# Patient Record
Sex: Male | Born: 1965 | Race: White | Hispanic: No | Marital: Single | Smoking: Current every day smoker
Health system: Southern US, Community
[De-identification: ages and names within clinical notes are randomized; demographics above are authoritative.]

## PROBLEM LIST (undated history)

## (undated) DIAGNOSIS — J449 Chronic obstructive pulmonary disease, unspecified: Secondary | ICD-10-CM

## (undated) DIAGNOSIS — J439 Emphysema, unspecified: Secondary | ICD-10-CM

---

## 2005-04-29 ENCOUNTER — Emergency Department: Payer: Self-pay | Admitting: Emergency Medicine

## 2005-07-25 ENCOUNTER — Emergency Department: Payer: Self-pay | Admitting: Emergency Medicine

## 2005-09-02 ENCOUNTER — Emergency Department: Payer: Self-pay | Admitting: Emergency Medicine

## 2005-11-18 ENCOUNTER — Emergency Department: Payer: Self-pay | Admitting: Emergency Medicine

## 2010-12-04 ENCOUNTER — Emergency Department: Payer: Self-pay | Admitting: Emergency Medicine

## 2014-01-27 ENCOUNTER — Emergency Department: Payer: Self-pay | Admitting: Emergency Medicine

## 2014-01-27 LAB — CBC WITH DIFFERENTIAL/PLATELET
BASOS PCT: 0.6 %
Basophil #: 0.1 10*3/uL (ref 0.0–0.1)
EOS ABS: 0 10*3/uL (ref 0.0–0.7)
Eosinophil %: 0.4 %
HCT: 38.6 % — ABNORMAL LOW (ref 40.0–52.0)
HGB: 12.7 g/dL — ABNORMAL LOW (ref 13.0–18.0)
LYMPHS ABS: 1.5 10*3/uL (ref 1.0–3.6)
Lymphocyte %: 16.5 %
MCH: 32.9 pg (ref 26.0–34.0)
MCHC: 32.8 g/dL (ref 32.0–36.0)
MCV: 100 fL (ref 80–100)
MONOS PCT: 8.9 %
Monocyte #: 0.8 x10 3/mm (ref 0.2–1.0)
Neutrophil #: 6.5 10*3/uL (ref 1.4–6.5)
Neutrophil %: 73.6 %
Platelet: 389 10*3/uL (ref 150–440)
RBC: 3.85 10*6/uL — ABNORMAL LOW (ref 4.40–5.90)
RDW: 13.3 % (ref 11.5–14.5)
WBC: 8.9 10*3/uL (ref 3.8–10.6)

## 2014-01-27 LAB — BASIC METABOLIC PANEL
Anion Gap: 8 (ref 7–16)
BUN: 18 mg/dL (ref 7–18)
CALCIUM: 9.2 mg/dL (ref 8.5–10.1)
Chloride: 105 mmol/L (ref 98–107)
Co2: 28 mmol/L (ref 21–32)
Creatinine: 1 mg/dL (ref 0.60–1.30)
EGFR (Non-African Amer.): >60
Glucose: 113 mg/dL — ABNORMAL HIGH (ref 65–99)
Osmolality: 284 (ref 275–301)
Potassium: 4.3 mmol/L (ref 3.5–5.1)
Sodium: 141 mmol/L (ref 136–145)

## 2014-05-10 ENCOUNTER — Emergency Department: Payer: Self-pay | Admitting: Emergency Medicine

## 2014-05-10 LAB — URINALYSIS, COMPLETE
BILIRUBIN, UR: NEGATIVE
Bacteria: NONE SEEN
Blood: NEGATIVE
Glucose,UR: NEGATIVE mg/dL (ref 0–75)
Hyaline Cast: 6
KETONE: NEGATIVE
Leukocyte Esterase: NEGATIVE
Nitrite: NEGATIVE
PH: 5 (ref 4.5–8.0)
Protein: NEGATIVE
RBC,UR: 1 /HPF (ref 0–5)
SPECIFIC GRAVITY: 1.025 (ref 1.003–1.030)

## 2014-05-10 LAB — CBC
HCT: 38.1 % — ABNORMAL LOW (ref 40.0–52.0)
HGB: 13.1 g/dL (ref 13.0–18.0)
MCH: 33.4 pg (ref 26.0–34.0)
MCHC: 34.4 g/dL (ref 32.0–36.0)
MCV: 97 fL (ref 80–100)
Platelet: 237 10*3/uL (ref 150–440)
RBC: 3.92 10*6/uL — ABNORMAL LOW (ref 4.40–5.90)
RDW: 12.8 % (ref 11.5–14.5)
WBC: 7.5 10*3/uL (ref 3.8–10.6)

## 2014-05-10 LAB — LIPASE, BLOOD: LIPASE: 285 U/L (ref 73–393)

## 2014-05-10 LAB — COMPREHENSIVE METABOLIC PANEL
ALBUMIN: 3.5 g/dL (ref 3.4–5.0)
ANION GAP: 6 — AB (ref 7–16)
Alkaline Phosphatase: 88 U/L
BUN: 16 mg/dL (ref 7–18)
Bilirubin,Total: 0.6 mg/dL (ref 0.2–1.0)
CREATININE: 1.52 mg/dL — AB (ref 0.60–1.30)
Calcium, Total: 8.5 mg/dL (ref 8.5–10.1)
Chloride: 106 mmol/L (ref 98–107)
Co2: 27 mmol/L (ref 21–32)
EGFR (African American): >60
EGFR (Non-African Amer.): 53 — ABNORMAL LOW
Glucose: 82 mg/dL (ref 65–99)
Osmolality: 278 (ref 275–301)
Potassium: 3.8 mmol/L (ref 3.5–5.1)
SGOT(AST): 30 U/L (ref 15–37)
SGPT (ALT): 31 U/L
Sodium: 139 mmol/L (ref 136–145)
TOTAL PROTEIN: 7.4 g/dL (ref 6.4–8.2)

## 2015-09-22 ENCOUNTER — Encounter: Payer: Self-pay | Admitting: Emergency Medicine

## 2015-09-22 ENCOUNTER — Emergency Department
Admission: EM | Admit: 2015-09-22 | Discharge: 2015-09-22 | Disposition: A | Discharge status: Home / Self Care | Payer: Self-pay | Attending: Emergency Medicine | Admitting: Emergency Medicine

## 2015-09-22 ENCOUNTER — Emergency Department: Payer: Self-pay

## 2015-09-22 DIAGNOSIS — F17210 Nicotine dependence, cigarettes, uncomplicated: Secondary | ICD-10-CM | POA: Insufficient documentation

## 2015-09-22 DIAGNOSIS — Y9289 Other specified places as the place of occurrence of the external cause: Secondary | ICD-10-CM | POA: Insufficient documentation

## 2015-09-22 DIAGNOSIS — Y9389 Activity, other specified: Secondary | ICD-10-CM | POA: Insufficient documentation

## 2015-09-22 DIAGNOSIS — Y998 Other external cause status: Secondary | ICD-10-CM | POA: Insufficient documentation

## 2015-09-22 DIAGNOSIS — W231XXA Caught, crushed, jammed, or pinched between stationary objects, initial encounter: Secondary | ICD-10-CM | POA: Insufficient documentation

## 2015-09-22 DIAGNOSIS — S60221A Contusion of right hand, initial encounter: Secondary | ICD-10-CM | POA: Insufficient documentation

## 2015-09-22 HISTORY — DX: Chronic obstructive pulmonary disease, unspecified: J44.9

## 2015-09-22 MED ORDER — OXYCODONE-ACETAMINOPHEN 5-325 MG PO TABS
1.0000 | ORAL_TABLET | Freq: Once | ORAL | Status: AC
  Administered 2015-09-22: 1 via ORAL
  Filled 2015-09-22: qty 1

## 2015-09-22 NOTE — ED Notes (Signed)
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

## 2015-09-22 NOTE — ED Notes (Signed)
Ice pack to right hand

## 2015-09-22 NOTE — ED Notes (Signed)
Patient has swelling to right hand.  States he injured the hand yesterday. Positive radial pulse.

## 2015-09-22 NOTE — ED Provider Notes (Signed)
[facility] Emergency Department Provider Note  ____________________________________________  Time seen: Approximately 10:42 AM  I have reviewed the triage vital signs and the nursing notes.   HISTORY  Chief Complaint Hand Injury    HPI Thomas Poole is a 50 y.o. male , NAD, reports to the emergency department with one-day history of right hand swelling and pain after blunt trauma last night. States he was on a ladder and hopping it down a roof line and got his right hand caught in between the ladder in the building. Has had significant swelling and pain since the injury. History of 3-4 old fractures of the right hand but is uncertain of exactly how many.   Past Medical History  Diagnosis Date  . COPD (chronic obstructive pulmonary disease) (HCC)     There are no active problems to display for this patient.   History reviewed. No pertinent past surgical history.  No current outpatient prescriptions on file.  Allergies Bee venom and Vicodin  No family history on file.  Social History Social History  Substance Use Topics  . Smoking status: Current Every Day Smoker -- 0.25 packs/day    Types: Cigarettes  . Smokeless tobacco: None  . Alcohol Use: Yes     Comment: occas     Review of Systems  Constitutional: No fever/chills, fatigue.  Eyes: No visual changes Cardiovascular: No chest pain. Respiratory:  No shortness of breath. No wheezing.  Gastrointestinal: No abdominal pain.  No nausea, vomiting.   Musculoskeletal: Positive right hand pain. Negative for back pain.  Skin: Positive bruising, swelling about right hand. Negative for rash, lacerations. Neurological: Negative for headaches, focal weakness or numbness. 10-point ROS otherwise negative.  ____________________________________________   PHYSICAL EXAM:  VITAL SIGNS: ED Triage Vitals  Enc Vitals Group     BP 09/22/15 1022 140/104 mmHg     Pulse Rate 09/22/15 1022 75   Resp 09/22/15 1022 18     Temp 09/22/15 1022 98.1 F (36.7 C)     Temp Source 09/22/15 1022 Oral     SpO2 09/22/15 1022 97 %     Weight 09/22/15 1013 228 lb (103.42 kg)     Height 09/22/15 1013 6' (1.829 m)     Head Cir --      Peak Flow --      Pain Score 09/22/15 1014 9     Pain Loc --      Pain Edu? --      Excl. in GC? --     Constitutional: Alert and oriented. Well appearing and in no acute distress. Eyes: Conjunctivae are normal. Head: Atraumatic. Cardiovascular:  Good peripheral circulation noting right upper extremity with 2+ pulses. Respiratory: Normal respiratory effort without tachypnea or retractions.  Musculoskeletal: Tenderness to palpation about the back of the right hand following metacarpals #2-5. Range of motion of the right hand limited due to pain and swelling.  Range of motion of fingers 133 within normal limits. Capillary refill less than 3 seconds of all right fingers. No joint effusions. Neurologic:  Normal speech and language. No gross focal neurologic deficits are appreciated.  Skin:  Skin is swollen with blue ecchymosis noted about the back of the right hand following metacarpals #2-5. No open wounds, lacerations. No oozing or weeping.  Psychiatric: Mood and affect are normal. Speech and behavior are normal. Patient exhibits appropriate insight and judgement.   ____________________________________________   LABS  None  ____________________________________________  EKG  None ____________________________________________  RADIOLOGY I  have personally viewed and evaluated these images (plain radiographs) as part of my medical decision making, as well as reviewing the written report by the radiologist.  Dg Hand Complete Right  09/22/2015  CLINICAL DATA:  Right hand swelling, patient's hand caught between a ladder and body yesterday, medial hand pain EXAM: RIGHT HAND - COMPLETE 3+ VIEW COMPARISON:  04/29/2005 FINDINGS: Five views of the right hand  submitted. No evidence of acute fracture or subluxation. Stable chronic deformity of right third metacarpal. There is interval old appearing fracture deformity of the fifth metacarpal. Clinical correlation is necessary. Diffuse soft tissue swelling metacarpal region. IMPRESSION: No evidence of acute fracture or subluxation. Stable chronic deformity of right third metacarpal. There is interval old appearing fracture deformity of the fifth metacarpal. Clinical correlation is necessary. Diffuse soft tissue swelling metacarpal region. Electronically Signed   By: Natasha Mead M.D.   On: 09/22/2015 11:31    ____________________________________________    PROCEDURES  Procedure(s) performed: None    Medications  oxyCODONE-acetaminophen (PERCOCET/ROXICET) 5-325 MG per tablet 1 tablet (1 tablet Oral Given 09/22/15 1146)     ____________________________________________   INITIAL IMPRESSION / ASSESSMENT AND PLAN / ED COURSE  Pertinent imaging results that were available during my care of the patient were reviewed by me and considered in my medical decision making (see chart for details).  Patient's diagnosis is consistent with contusion of the right hand as no acute fractures noted on x-ray today. Patient will be discharged home with instructions to take Tylenol [phone] mg every 6-8 hours as needed for pain. Apply ice to the affected area 20 minutes 3-4 times daily. Keep right hand elevated. Patient notes he takes a baby aspirin daily for heart health and I have advised for him to hold such for 2-3 days until swelling and bruising have decreased. Patient is to follow up with Dr. Lutricia Horsfall with [facility] if symptoms persist past this treatment course. Patient is given ED precautions to return to the ED for any worsening or new symptoms.      ____________________________________________  FINAL CLINICAL IMPRESSION(S) / ED DIAGNOSES  Final diagnoses:  Contusion of right hand,  initial encounter      NEW MEDICATIONS STARTED DURING THIS VISIT:  There are no discharge medications for this patient.        Hope Pigeon, PA-C 09/22/15 1203  Jennye Moccasin, MD 09/22/15 1229

## 2015-09-22 NOTE — Discharge Instructions (Signed)
Hand Contusion °A hand contusion is a deep bruise on your hand area. Contusions are the result of an injury that caused bleeding under the skin. The contusion may turn blue, purple, or yellow. Minor injuries will give you a painless contusion, but more severe contusions may stay painful and swollen for a few weeks. °CAUSES  °A contusion is usually caused by a blow, trauma, or direct force to an area of the body. °SYMPTOMS  °· Swelling and redness of the injured area. °· Discoloration of the injured area. °· Tenderness and soreness of the injured area. °· Pain. °DIAGNOSIS  °The diagnosis can be made by taking a history and performing a physical exam. An X-ray, CT scan, or MRI may be needed to determine if there were any associated injuries, such as broken bones (fractures). °TREATMENT  °Often, the best treatment for a hand contusion is resting, elevating, icing, and applying cold compresses to the injured area. Over-the-counter medicines may also be recommended for pain control. °HOME CARE INSTRUCTIONS  °· Put ice on the injured area. °· Put ice in a plastic bag. °· Place a towel between your skin and the bag. °· Leave the ice on for 15-20 minutes, 03-04 times a day. °· Only take over-the-counter or prescription medicines as directed by your caregiver. Your caregiver may recommend avoiding anti-inflammatory medicines (aspirin, ibuprofen, and naproxen) for 48 hours because these medicines may increase bruising. °· If told, use an elastic wrap as directed. This can help reduce swelling. You may remove the wrap for sleeping, showering, and bathing. If your fingers become numb, cold, or blue, take the wrap off and reapply it more loosely. °· Elevate your hand with pillows to reduce swelling. °· Avoid overusing your hand if it is painful. °SEEK IMMEDIATE MEDICAL CARE IF:  °· You have increased redness, swelling, or pain in your hand. °· Your swelling or pain is not relieved with medicines. °· You have loss of feeling in  your hand or are unable to move your fingers. °· Your hand turns cold or blue. °· You have pain when you move your fingers. °· Your hand becomes warm to the touch. °· Your contusion does not improve in 2 days. °MAKE SURE YOU:  °· Understand these instructions. °· Will watch your condition. °· Will get help right away if you are not doing well or get worse. °  °This information is not intended to replace advice given to you by your health care provider. Make sure you discuss any questions you have with your health care provider. °  °Document Released: 01/12/2002 Document Revised: 04/16/2012 Document Reviewed: 01/14/2012 °Elsevier Interactive Patient Education ©2016 [employer] ° °Cryotherapy °Cryotherapy means treatment with cold. Ice or gel packs can be used to reduce both pain and swelling. Ice is the most helpful within the first 24 to 48 hours after an injury or flare-up from overusing a muscle or joint. Sprains, strains, spasms, burning pain, shooting pain, and aches can all be eased with ice. Ice can also be used when recovering from surgery. Ice is effective, has very few side effects, and is safe for most people to use. °PRECAUTIONS  °Ice is not a safe treatment option for people with: °· Raynaud phenomenon. This is a condition affecting small blood vessels in the extremities. Exposure to cold may cause your problems to return. °· Cold hypersensitivity. There are many forms of cold hypersensitivity, including: °¨ Cold urticaria. Red, itchy hives appear on the skin when the tissues begin to warm   after being iced. °¨ Cold erythema. This is a red, itchy rash caused by exposure to cold. °¨ Cold hemoglobinuria. Red blood cells break down when the tissues begin to warm after being iced. The hemoglobin that carry oxygen are passed into the urine because they cannot combine with blood proteins fast enough. °· Numbness or altered sensitivity in the area being iced. °If you have any of the following conditions, do  not use ice until you have discussed cryotherapy with your caregiver: °· Heart conditions, such as arrhythmia, angina, or chronic heart disease. °· High blood pressure. °· Healing wounds or open skin in the area being iced. °· Current infections. °· Rheumatoid arthritis. °· Poor circulation. °· Diabetes. °Ice slows the blood flow in the region it is applied. This is beneficial when trying to stop inflamed tissues from spreading irritating chemicals to surrounding tissues. However, if you expose your skin to cold temperatures for too long or without the proper protection, you can damage your skin or nerves. Watch for signs of skin damage due to cold. °HOME CARE INSTRUCTIONS °Follow these tips to use ice and cold packs safely. °· Place a dry or damp towel between the ice and skin. A damp towel will cool the skin more quickly, so you may need to shorten the time that the ice is used. °· For a more rapid response, add gentle compression to the ice. °· Ice for no more than 10 to 20 minutes at a time. The bonier the area you are icing, the less time it will take to get the benefits of ice. °· Check your skin after 5 minutes to make sure there are no signs of a poor response to cold or skin damage. °· Rest 20 minutes or more between uses. °· Once your skin is numb, you can end your treatment. You can test numbness by very lightly touching your skin. The touch should be so light that you do not see the skin dimple from the pressure of your fingertip. When using ice, most people will feel these normal sensations in this order: cold, burning, aching, and numbness. °· Do not use ice on someone who cannot communicate their responses to pain, such as small children or people with dementia. °HOW TO MAKE AN ICE PACK °Ice packs are the most common way to use ice therapy. Other methods include ice massage, ice baths, and cryosprays. Muscle creams that cause a cold, tingly feeling do not offer the same benefits that ice offers and  should not be used as a substitute unless recommended by your caregiver. °To make an ice pack, do one of the following: °· Place crushed ice or a bag of frozen vegetables in a sealable plastic bag. Squeeze out the excess air. Place this bag inside another plastic bag. Slide the bag into a pillowcase or place a damp towel between your skin and the bag. °· Mix 3 parts water with 1 part rubbing alcohol. Freeze the mixture in a sealable plastic bag. When you remove the mixture from the freezer, it will be slushy. Squeeze out the excess air. Place this bag inside another plastic bag. Slide the bag into a pillowcase or place a damp towel between your skin and the bag. °SEEK MEDICAL CARE IF: °· You develop white spots on your skin. This may give the skin a blotchy (mottled) appearance. °· Your skin turns blue or pale. °· Your skin becomes waxy or hard. °· Your swelling gets worse. °MAKE SURE YOU:  °· Understand   these instructions. °· Will watch your condition. °· Will get help right away if you are not doing well or get worse. °  °This information is not intended to replace advice given to you by your health care provider. Make sure you discuss any questions you have with your health care provider. °  °Document Released: 03/19/2011 Document Revised: 08/13/2014 Document Reviewed: 03/19/2011 °Elsevier Interactive Patient Education ©2016 Elsevier Inc. ° °

## 2016-12-07 ENCOUNTER — Emergency Department: Payer: Self-pay

## 2016-12-07 ENCOUNTER — Encounter: Payer: Self-pay | Admitting: Emergency Medicine

## 2016-12-07 ENCOUNTER — Emergency Department
Admission: EM | Admit: 2016-12-07 | Discharge: 2016-12-08 | Disposition: A | Discharge status: Home / Self Care | Payer: Self-pay | Attending: Emergency Medicine | Admitting: Emergency Medicine

## 2016-12-07 DIAGNOSIS — F410 Panic disorder [episodic paroxysmal anxiety] without agoraphobia: Secondary | ICD-10-CM | POA: Insufficient documentation

## 2016-12-07 DIAGNOSIS — R202 Paresthesia of skin: Secondary | ICD-10-CM

## 2016-12-07 DIAGNOSIS — Z79899 Other long term (current) drug therapy: Secondary | ICD-10-CM | POA: Insufficient documentation

## 2016-12-07 DIAGNOSIS — F1410 Cocaine abuse, uncomplicated: Secondary | ICD-10-CM | POA: Insufficient documentation

## 2016-12-07 DIAGNOSIS — F17210 Nicotine dependence, cigarettes, uncomplicated: Secondary | ICD-10-CM | POA: Insufficient documentation

## 2016-12-07 DIAGNOSIS — F1910 Other psychoactive substance abuse, uncomplicated: Secondary | ICD-10-CM | POA: Insufficient documentation

## 2016-12-07 DIAGNOSIS — R0689 Other abnormalities of breathing: Secondary | ICD-10-CM | POA: Insufficient documentation

## 2016-12-07 DIAGNOSIS — J449 Chronic obstructive pulmonary disease, unspecified: Secondary | ICD-10-CM | POA: Insufficient documentation

## 2016-12-07 DIAGNOSIS — F1210 Cannabis abuse, uncomplicated: Secondary | ICD-10-CM | POA: Insufficient documentation

## 2016-12-07 DIAGNOSIS — F1010 Alcohol abuse, uncomplicated: Secondary | ICD-10-CM | POA: Insufficient documentation

## 2016-12-07 DIAGNOSIS — Z7982 Long term (current) use of aspirin: Secondary | ICD-10-CM | POA: Insufficient documentation

## 2016-12-07 HISTORY — DX: Emphysema, unspecified: J43.9

## 2016-12-07 LAB — URINALYSIS, COMPLETE (UACMP) WITH MICROSCOPIC
BILIRUBIN URINE: NEGATIVE
Bacteria, UA: NONE SEEN
GLUCOSE, UA: NEGATIVE mg/dL
HGB URINE DIPSTICK: NEGATIVE
Ketones, ur: NEGATIVE mg/dL
LEUKOCYTES UA: NEGATIVE
NITRITE: NEGATIVE
PH: 5 (ref 5.0–8.0)
Protein, ur: NEGATIVE mg/dL
SPECIFIC GRAVITY, URINE: 1.013 (ref 1.005–1.030)
Squamous Epithelial / LPF: NONE SEEN

## 2016-12-07 LAB — CBC WITH DIFFERENTIAL/PLATELET
BASOS PCT: 1 %
Basophils Absolute: 0.1 10*3/uL (ref 0–0.1)
Eosinophils Absolute: 0.1 10*3/uL (ref 0–0.7)
Eosinophils Relative: 1 %
HEMATOCRIT: 38.3 % — AB (ref 40.0–52.0)
Hemoglobin: 13.3 g/dL (ref 13.0–18.0)
LYMPHS ABS: 3.5 10*3/uL (ref 1.0–3.6)
Lymphocytes Relative: 42 %
MCH: 33.7 pg (ref 26.0–34.0)
MCHC: 34.8 g/dL (ref 32.0–36.0)
MCV: 97.1 fL (ref 80.0–100.0)
MONO ABS: 0.8 10*3/uL (ref 0.2–1.0)
Monocytes Relative: 10 %
NEUTROS ABS: 3.7 10*3/uL (ref 1.4–6.5)
Neutrophils Relative %: 46 %
PLATELETS: 275 10*3/uL (ref 150–440)
RBC: 3.94 MIL/uL — ABNORMAL LOW (ref 4.40–5.90)
RDW: 13.7 % (ref 11.5–14.5)
WBC: 8.2 10*3/uL (ref 3.8–10.6)

## 2016-12-07 LAB — COMPREHENSIVE METABOLIC PANEL
ALK PHOS: 57 U/L (ref 38–126)
ALT: 21 U/L (ref 17–63)
AST: 37 U/L (ref 15–41)
Albumin: 3.9 g/dL (ref 3.5–5.0)
Anion gap: 10 (ref 5–15)
BUN: 11 mg/dL (ref 6–20)
CHLORIDE: 105 mmol/L (ref 101–111)
CO2: 24 mmol/L (ref 22–32)
CREATININE: 1.13 mg/dL (ref 0.61–1.24)
Calcium: 9.5 mg/dL (ref 8.9–10.3)
GFR calc Af Amer: >60 mL/min (ref >60)
GFR calc non Af Amer: >60 mL/min (ref >60)
Glucose, Bld: 114 mg/dL — ABNORMAL HIGH (ref 65–99)
Potassium: 3.9 mmol/L (ref 3.5–5.1)
SODIUM: 139 mmol/L (ref 135–145)
Total Bilirubin: 0.5 mg/dL (ref 0.3–1.2)
Total Protein: 6.8 g/dL (ref 6.5–8.1)

## 2016-12-07 LAB — LIPASE, BLOOD: Lipase: 26 U/L (ref 11–51)

## 2016-12-07 LAB — TROPONIN I: Troponin I: <0.03 ng/mL (ref <0.03)

## 2016-12-07 LAB — SALICYLATE LEVEL: Salicylate Lvl: <7 mg/dL (ref 2.8–30.0)

## 2016-12-07 LAB — ACETAMINOPHEN LEVEL: Acetaminophen (Tylenol), Serum: <10 ug/mL — ABNORMAL LOW (ref 10–30)

## 2016-12-07 LAB — CK: Total CK: 461 U/L — ABNORMAL HIGH (ref 49–397)

## 2016-12-07 LAB — BRAIN NATRIURETIC PEPTIDE: B NATRIURETIC PEPTIDE 5: 7 pg/mL (ref 0.0–100.0)

## 2016-12-07 MED ORDER — LORAZEPAM 2 MG/ML IJ SOLN
INTRAMUSCULAR | Status: AC
  Administered 2016-12-07: 1 mg via INTRAVENOUS
  Filled 2016-12-07: qty 1

## 2016-12-07 MED ORDER — SODIUM CHLORIDE 0.9 % IV BOLUS (SEPSIS)
1000.0000 mL | Freq: Once | INTRAVENOUS | Status: AC
  Administered 2016-12-07: 1000 mL via INTRAVENOUS

## 2016-12-07 MED ORDER — ASPIRIN 81 MG PO CHEW
CHEWABLE_TABLET | ORAL | Status: AC
Start: 2016-12-07 — End: 2016-12-07
  Administered 2016-12-07: 324 mg via ORAL
  Filled 2016-12-07: qty 4

## 2016-12-07 MED ORDER — LORAZEPAM 2 MG/ML IJ SOLN
1.0000 mg | Freq: Once | INTRAMUSCULAR | Status: AC
  Administered 2016-12-07: 1 mg via INTRAVENOUS
  Filled 2016-12-07: qty 1

## 2016-12-07 NOTE — ED Provider Notes (Addendum)
[facility] Emergency Department Provider Note  ____________________________________________   I have reviewed the triage vital signs and the nursing notes.   HISTORY  Chief Complaint Chest Pain    HPI Thomas Poole is a 51 y.o. male with a history of cocaine abusealcohol abuse, who drank alcohol the sprain used cocaine last night, also history of panic attacks, states that he did not chest pain today however, he became very anxious and jittery and he felt like his entire body was tingling. No focal neurologic deficit, just whole body tingling. This has been going on now since shortly before arrival. He denies any headache or fever. He denies any difficulty talking. He denies vomiting. He does not believe he is in alcohol withdrawal because he just drank alcohol this morning. He states that he has had no chest pain although he "may have had some" a few days ago. EKG showed what I believe to be repolarization abnormality but he is brought in as a precaution as a code STEMI by EMS. We did discuss with the on-call cardiologist after reviewing EKG and they do not feel that STEMI is indicated.   Patient states his symptoms are going on for about a half hour to 45 minutes nothing makes them better nothing makes them worse, no antecedent intervention has been performed.   Past Medical History:  Diagnosis Date  . COPD (chronic obstructive pulmonary disease) (HCC)   . Emphysema lung (HCC)     There are no active problems to display for this patient.   History reviewed. No pertinent surgical history.  Prior to Admission medications   Medication Sig Start Date End Date Taking? Authorizing Provider  albuterol (PROVENTIL HFA;VENTOLIN HFA) 108 (90 Base) MCG/ACT inhaler Inhale 1 puff into the lungs every 6 (six) hours as needed. 04/26/16  Yes Historical Provider, MD  aspirin EC 81 MG tablet Take 81 mg by mouth daily.   Yes Historical Provider, MD  loratadine  (CLARITIN) 10 MG tablet Take 10 mg by mouth daily. 04/26/16 04/26/17 Yes Historical Provider, MD  pravastatin (PRAVACHOL) 40 MG tablet Take 40 mg by mouth every evening. 04/26/16 04/26/17 Yes Historical Provider, MD  tiotropium (SPIRIVA HANDIHALER) 18 MCG inhalation capsule Place 1 puff into inhaler and inhale daily. 08/16/16  Yes Historical Provider, MD    Allergies Bee venom and Vicodin [hydrocodone-acetaminophen]  No family history on file.  Social History Social History  Substance Use Topics  . Smoking status: Current Every Day Smoker    Packs/day: 0.25    Types: Cigarettes  . Smokeless tobacco: Not on file  . Alcohol use Yes     Comment: occas    Review of Systems Constitutional: No fever/chills Eyes: No visual changes. ENT: No sore throat. No stiff neck no neck pain Cardiovascular: Denies chest pain. Respiratory: Denies shortness of breath. Gastrointestinal:   no vomiting.  No diarrhea.  No constipation. Genitourinary: Negative for dysuria. Musculoskeletal: Negative lower extremity swelling Skin: Negative for rash. Neurological: Negative for severe headaches, focal weakness or numbness. 10-point ROS otherwise negative.  ____________________________________________   PHYSICAL EXAM:  VITAL SIGNS: ED Triage Vitals  Enc Vitals Group     BP 12/07/16 1916 (!) 148/75     Pulse Rate 12/07/16 1916 (!) 108     Resp 12/07/16 1916 (!) 30     Temp 12/07/16 1916 98.3 F (36.8 C)     Temp Source 12/07/16 1916 Oral     SpO2 12/07/16 1916 100 %  Weight 12/07/16 1917 219 lb 6.4 oz (99.5 kg)     Height 12/07/16 1917 6\' 1"  (1.854 m)     Head Circumference --      Peak Flow --      Pain Score --      Pain Loc --      Pain Edu? --      Excl. in GC? --     Constitutional: Alert and oriented. Not medically toxic but very very anxious hyperventilating and as he describes it "freaking out".  Eyes: Conjunctivae are normal. PERRL. EOMI. Head: Atraumatic. Nose: No  congestion/rhinnorhea. Mouth/Throat: Mucous membranes are moist.  Oropharynx non-erythematous. Neck: No stridor.   Nontender with no meningismus Cardiovascular: Normal rate, regular rhythm. Grossly normal heart sounds.  Good peripheral circulation. Respiratory: Normal respiratory effort.  No retractions. Lungs CTAB. Abdominal: Soft and nontender. No distention. No guarding no rebound Back:  There is no focal tenderness or step off.  there is no midline tenderness there are no lesions noted. there is no CVA tenderness Musculoskeletal: No lower extremity tenderness, no upper extremity tenderness. No joint effusions, no DVT signs strong distal pulses no edema Neurologic:  Normal speech and language. No gross focal neurologic deficits are appreciated.  Skin:  Skin is warm, dry and intact. No rash noted. Psychiatric: Mood and affect are very very anxious. Speech and behavior are normal.  ____________________________________________   LABS (all labs ordered are listed, but only abnormal results are displayed)  Labs Reviewed  CBC WITH DIFFERENTIAL/PLATELET  COMPREHENSIVE METABOLIC PANEL  LIPASE, BLOOD  ACETAMINOPHEN LEVEL  SALICYLATE LEVEL  TROPONIN I  BRAIN NATRIURETIC PEPTIDE  URINALYSIS, COMPLETE (UACMP) WITH MICROSCOPIC  CK   ____________________________________________  EKG  I personally interpreted any EKGs ordered by me or triage 2 different EKGs were performed in short order, the first shows normal sinus tachycardia, rate 107, there is nonspecific ST changes including anterior wedge is likely repolarization abnormality. This is consistent with EMS transmission. No other changes in her unusual noted. Normal axis. LVH Shows the same. Repolarization abnormality normal axis rate 109, no acute ST elevation ____________________________________________  RADIOLOGY  I reviewed any imaging ordered by me or triage that were performed during my shift and, if possible, patient and/or  family made aware of any abnormal findings. ____________________________________________   PROCEDURES  Procedure(s) performed: None  Procedures  Critical Care performed: None  ____________________________________________   INITIAL IMPRESSION / ASSESSMENT AND PLAN / ED COURSE  Pertinent labs & imaging results that were available during my care of the patient were reviewed by me and considered in my medical decision making (see chart for details).  We did transmit the EKGs to Dr. Azucena Kuba of cardiology, he does not feel that this is a STEMI, we therefore canceled the stomach that was called in the field. The patient is here because he has full body tingling and is having what he describes as a panic attack. We gave him Ativan, all of his symptoms have resolved at this point. We are checking his blood work. He does not appear to be in alcohol withdrawal, he just is very anxious. He was hyperventilating. I did coach his breathing, I think is perioral and an hand and feet tingling was because of his hyperventilation which she describes as anxiety. Low suspicion for ACS PE or dissection however we did send a cardiac enzyme and we likely will repeated. The patient blood pressure and vital signs are reassuring at this time. Blood work is pending.  -----------------------------------------  8:53 PM on 12/07/2016 -----------------------------------------  And resting With family states that he has had a lot of stress recently as he is homeless and using multiple illegal substances. He has no SI or HI, he has a long history of panic attacks according the family. He feels much better at this time. Signed out at the end of my shift. Patient will require second troponin.  ----------------------------------------- 9:00 PM on 12/07/2016 ----------------------------------------- Patient with full substance abuse issues. He was with his mother in the room. I deliberately therefore made no reference to any of  his substance abuse issues when discussing the causality for his multiple symptoms today. He did however on his own voluntarily tell his mother, in conjunction with his wife, that he had been using cocaine and marijuana and alcohol This information came from him. I did explain to the patient that I could not talk about it in front of his mother and he of his own volition informed his mother of these things of which she apparently was already aware. This is certainly his choice because he is in charge of his medical records in terms of family knowledge..  Signed out to dr. Fanny BienQuale at this time.     ____________________________________________   FINAL CLINICAL IMPRESSION(S) / ED DIAGNOSES  Final diagnoses:  Tingling      This chart was dictated using voice recognition software.  Despite best efforts to proofread,  errors can occur which can change meaning.      Jeanmarie PlantJames A Othman Masur, MD 12/07/16 2023    Jeanmarie PlantJames A Meilani Edmundson, MD 12/07/16 [id number]    Jeanmarie PlantJames A Geffrey Michaelsen, MD 12/07/16 [id number]    Jeanmarie PlantJames A Alexsia Klindt, MD 12/07/16 2106

## 2016-12-07 NOTE — ED Notes (Signed)
Patients mom stopped in the hallway asking about him doing heroin. Went into room and mom grabbed the piece of paper from EMS with the notes on it from what was reported. Patient mom stated, "I took a picture of this paper,it says he did heroin, I have been sitting here cussing my son out for 10 minutes." I explained to mom that he denied using heroin. Patient mom stated, "you should leave this laying here for us to see" and waved paper at me. Consulting civil engineerCharge RN and EDP aware of incident.

## 2016-12-07 NOTE — ED Triage Notes (Signed)
[facility] Per [facility] had elevation in leads V2 & V3. Patient had chest pain yesterday and it went away. Today he felt like his whole body was numb and tingling. Patient very anxious upon arrival. Patient did admit to using cocaine yesterday and marijuana before pain started. Patient also drinks 2 beers a day. EDP at bedside upon EMS arrival.

## 2016-12-07 NOTE — ED Notes (Signed)
Per EMS he is "not lumbie"

## 2016-12-07 NOTE — ED Notes (Signed)
Per EDP patient is too sleepy to leave right now. Will re-evaluate later.

## 2016-12-07 NOTE — ED Notes (Signed)
Family at bedside. 

## 2016-12-07 NOTE — ED Provider Notes (Signed)
Ongoing care assigned to Dr. Zenda AlpersWebster  Patient reevaluated by me, is currently resting, but in very somnolent after receiving Ativan. We'll continue to observe the patient, anticipate discharge, by this point he is fairly sedated and is not yet ready to ambulate independently or be able to go home with his mother.   Sharyn CreamerMark Lorah Kalina, MD 12/07/16 [phone]

## 2016-12-08 NOTE — ED Notes (Signed)
Patient still groggy. Will give it a little more time. Family is not comfortable leaving with him this sleepy still.

## 2017-03-19 ENCOUNTER — Emergency Department
Admission: EM | Admit: 2017-03-19 | Discharge: 2017-03-19 | Disposition: A | Discharge status: Home / Self Care | Payer: Self-pay | Attending: Emergency Medicine | Admitting: Emergency Medicine

## 2017-03-19 ENCOUNTER — Encounter: Payer: Self-pay | Admitting: Emergency Medicine

## 2017-03-19 DIAGNOSIS — J449 Chronic obstructive pulmonary disease, unspecified: Secondary | ICD-10-CM | POA: Insufficient documentation

## 2017-03-19 DIAGNOSIS — S91311A Laceration without foreign body, right foot, initial encounter: Secondary | ICD-10-CM | POA: Insufficient documentation

## 2017-03-19 DIAGNOSIS — Y999 Unspecified external cause status: Secondary | ICD-10-CM | POA: Insufficient documentation

## 2017-03-19 DIAGNOSIS — Y92009 Unspecified place in unspecified non-institutional (private) residence as the place of occurrence of the external cause: Secondary | ICD-10-CM | POA: Insufficient documentation

## 2017-03-19 DIAGNOSIS — Y939 Activity, unspecified: Secondary | ICD-10-CM | POA: Insufficient documentation

## 2017-03-19 DIAGNOSIS — F17210 Nicotine dependence, cigarettes, uncomplicated: Secondary | ICD-10-CM | POA: Insufficient documentation

## 2017-03-19 DIAGNOSIS — Z79899 Other long term (current) drug therapy: Secondary | ICD-10-CM | POA: Insufficient documentation

## 2017-03-19 MED ORDER — BACITRACIN ZINC 500 UNIT/GM EX OINT
TOPICAL_OINTMENT | CUTANEOUS | Status: AC
  Filled 2017-03-19: qty 0.9

## 2017-03-19 MED ORDER — AMOXICILLIN-POT CLAVULANATE 875-125 MG PO TABS
1.0000 | ORAL_TABLET | Freq: Once | ORAL | Status: AC
  Administered 2017-03-19: 1 via ORAL
  Filled 2017-03-19: qty 1

## 2017-03-19 MED ORDER — LIDOCAINE HCL (PF) 1 % IJ SOLN
INTRAMUSCULAR | Status: AC
  Filled 2017-03-19: qty 15

## 2017-03-19 MED ORDER — AMOXICILLIN-POT CLAVULANATE 875-125 MG PO TABS: 1.0000 | ORAL_TABLET | Freq: Two times a day (BID) | ORAL | 0 refills | Status: AC
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
## 2019-01-28 IMAGING — DX DG CHEST 1V PORT
1 series · 1 of 1 positions shown · non-contrast
Comparison: No priors.

CLINICAL DATA: 50-year-old male with history of chest pain
yesterday.

EXAM:
PORTABLE CHEST 1 VIEW

[chest ap]
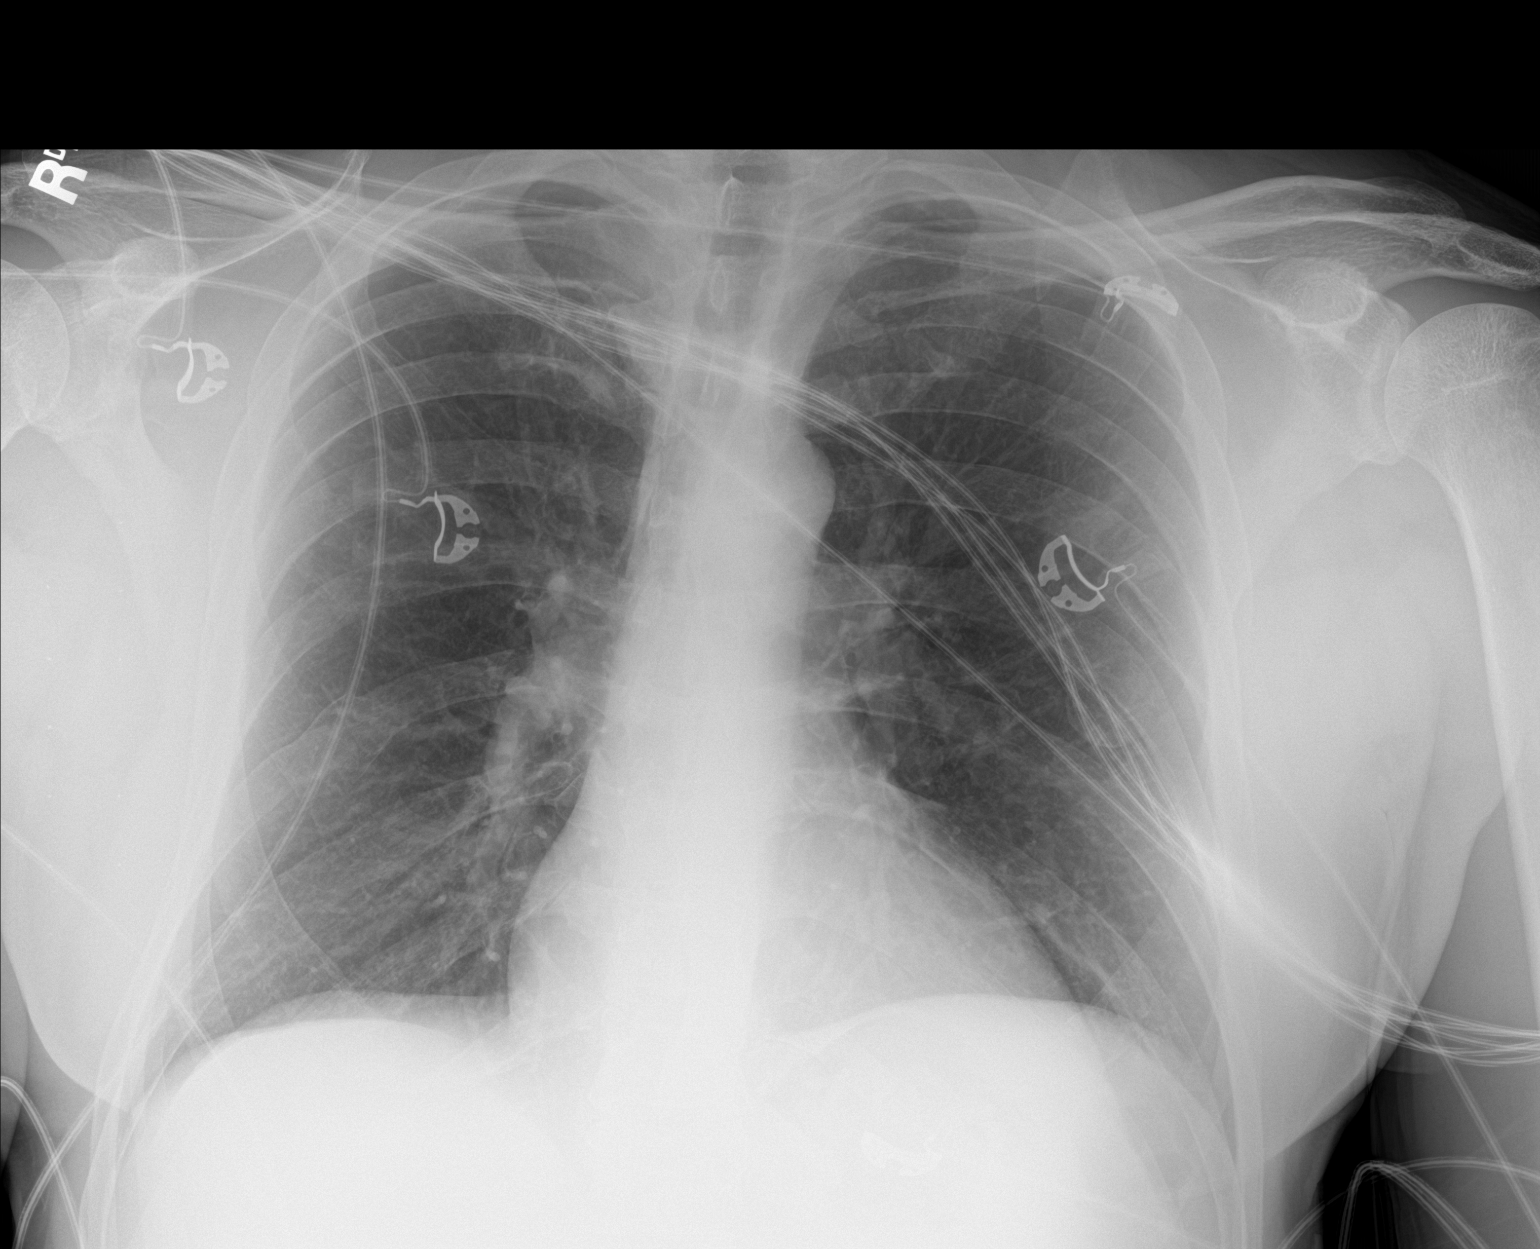

[1 of 1 positions shown; findings below may reference images not displayed]

FINDINGS: Lung volumes are low. No consolidative airspace disease. No pleural
effusions. No pneumothorax. No pulmonary nodule or mass noted.
Pulmonary vasculature and the cardiomediastinal silhouette are
within normal limits.
IMPRESSION: 1. Low lung volumes without radiographic evidence of acute
cardiopulmonary disease.

## 2019-12-22 ENCOUNTER — Other Ambulatory Visit: Payer: Self-pay

## 2019-12-22 ENCOUNTER — Emergency Department
Admission: EM | Admit: 2019-12-22 | Discharge: 2019-12-22 | Disposition: A | Discharge status: Home / Self Care | Payer: Self-pay | Attending: Emergency Medicine | Admitting: Emergency Medicine

## 2019-12-22 DIAGNOSIS — Y999 Unspecified external cause status: Secondary | ICD-10-CM | POA: Insufficient documentation

## 2019-12-22 DIAGNOSIS — S61012A Laceration without foreign body of left thumb without damage to nail, initial encounter: Secondary | ICD-10-CM | POA: Insufficient documentation

## 2019-12-22 DIAGNOSIS — Z5321 Procedure and treatment not carried out due to patient leaving prior to being seen by health care provider: Secondary | ICD-10-CM | POA: Insufficient documentation

## 2019-12-22 DIAGNOSIS — Y929 Unspecified place or not applicable: Secondary | ICD-10-CM | POA: Insufficient documentation

## 2019-12-22 DIAGNOSIS — Y9389 Activity, other specified: Secondary | ICD-10-CM | POA: Insufficient documentation
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
## 2020-11-09 ENCOUNTER — Emergency Department (HOSPITAL_COMMUNITY)
Admission: EM | Admit: 2020-11-09 | Discharge: 2020-11-10 | Attending: Emergency Medicine | Admitting: Emergency Medicine

## 2020-11-09 ENCOUNTER — Other Ambulatory Visit: Payer: Self-pay

## 2020-11-09 DIAGNOSIS — R569 Unspecified convulsions: Secondary | ICD-10-CM | POA: Diagnosis not present

## 2020-11-09 DIAGNOSIS — D649 Anemia, unspecified: Secondary | ICD-10-CM | POA: Diagnosis not present

## 2020-11-09 DIAGNOSIS — Z7982 Long term (current) use of aspirin: Secondary | ICD-10-CM | POA: Diagnosis not present

## 2020-11-09 DIAGNOSIS — J449 Chronic obstructive pulmonary disease, unspecified: Secondary | ICD-10-CM | POA: Insufficient documentation

## 2020-11-09 DIAGNOSIS — F17210 Nicotine dependence, cigarettes, uncomplicated: Secondary | ICD-10-CM | POA: Diagnosis not present

## 2020-11-09 LAB — CBG MONITORING, ED: Glucose-Capillary: 87 mg/dL (ref 70–99)

## 2020-11-09 MED ORDER — LEVETIRACETAM IN NACL 1000 MG/100ML IV SOLN
1000.0000 mg | Freq: Once | INTRAVENOUS | Status: AC
  Administered 2020-11-10: 1000 mg via INTRAVENOUS
  Filled 2020-11-09: qty 100
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
## 2020-11-10 LAB — BASIC METABOLIC PANEL
Anion gap: 11 (ref 5–15)
BUN: 18 mg/dL (ref 6–20)
CO2: 21 mmol/L — ABNORMAL LOW (ref 22–32)
Calcium: 9 mg/dL (ref 8.9–10.3)
Chloride: 107 mmol/L (ref 98–111)
Creatinine, Ser: 1 mg/dL (ref 0.61–1.24)
GFR, Estimated: >60 mL/min (ref >60)
Glucose, Bld: 87 mg/dL (ref 70–99)
Potassium: 3.5 mmol/L (ref 3.5–5.1)
Sodium: 139 mmol/L (ref 135–145)

## 2020-11-10 LAB — CBC WITH DIFFERENTIAL/PLATELET
Abs Immature Granulocytes: 0.01 K/uL (ref 0.00–0.07)
Basophils Absolute: 0.1 K/uL (ref 0.0–0.1)
Basophils Relative: 1 %
Eosinophils Absolute: 0.1 K/uL (ref 0.0–0.5)
Eosinophils Relative: 2 %
HCT: 35.1 % — ABNORMAL LOW (ref 39.0–52.0)
Hemoglobin: 11.8 g/dL — ABNORMAL LOW (ref 13.0–17.0)
Immature Granulocytes: 0 %
Lymphocytes Relative: 32 %
Lymphs Abs: 2.3 K/uL (ref 0.7–4.0)
MCH: 33.5 pg (ref 26.0–34.0)
MCHC: 33.6 g/dL (ref 30.0–36.0)
MCV: 99.7 fL (ref 80.0–100.0)
Monocytes Absolute: 0.6 K/uL (ref 0.1–1.0)
Monocytes Relative: 9 %
Neutro Abs: 4.1 K/uL (ref 1.7–7.7)
Neutrophils Relative %: 56 %
Platelets: 242 K/uL (ref 150–400)
RBC: 3.52 MIL/uL — ABNORMAL LOW (ref 4.22–5.81)
RDW: 13.3 % (ref 11.5–15.5)
WBC: 7.2 K/uL (ref 4.0–10.5)
nRBC: 0 % (ref 0.0–0.2)

## 2020-11-10 LAB — ETHANOL: Alcohol, Ethyl (B): <10 mg/dL (ref <10)

## 2020-11-10 MED ORDER — FLUOXETINE HCL 20 MG PO CAPS: 20.0000 mg | ORAL_CAPSULE | Freq: Every day | ORAL | Status: DC

## 2020-11-10 MED ORDER — FLUOXETINE HCL 20 MG PO CAPS
20.0000 mg | ORAL_CAPSULE | Freq: Every day | ORAL | Status: DC
  Administered 2020-11-10: 20 mg via ORAL
  Filled 2020-11-10: qty 1

## 2020-11-10 MED ORDER — DIAZEPAM 5 MG PO TABS
10.0000 mg | ORAL_TABLET | Freq: Once | ORAL | Status: AC
  Administered 2020-11-10: 10 mg via ORAL
  Filled 2020-11-10: qty 2

## 2020-11-10 NOTE — Discharge Instructions (Signed)
Make sure to take all of your medications as prescribed - especially your seizure medication.  Your labs do show that you have developed a mild anemia.  Please follow-up with your primary care provider for further evaluation of this.
